# Patient Record
Sex: Male | Born: 2000 | Hispanic: Yes | Marital: Single | Smoking: Never smoker
Health system: Southern US, Community
[De-identification: ages and names within clinical notes are randomized; demographics above are authoritative.]

## PROBLEM LIST (undated history)

## (undated) HISTORY — PX: FOOT SURGERY: SHX648

---
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
## 2019-05-30 ENCOUNTER — Ambulatory Visit: Payer: Self-pay | Attending: Internal Medicine

## 2019-05-30 DIAGNOSIS — Z23 Encounter for immunization: Secondary | ICD-10-CM

## 2019-05-30 NOTE — Progress Notes (Signed)
   Covid-19 Vaccination Clinic  Name:  [place] Villarreal-Montoya    MRN: [id number] DOB: 2000-04-05  05/30/2019  Mr. Savoca was observed post Covid-19 immunization for 15 minutes without incident. He was provided with Vaccine Information Sheet and instruction to access the V-Safe system.   Mr. Bremer was instructed to call 911 with any severe reactions post vaccine: Marland Kitchen Difficulty breathing  . Swelling of face and throat  . A fast heartbeat  . A bad rash all over body  . Dizziness and weakness   Immunizations Administered    Name Date Dose VIS Date Route   Pfizer COVID-19 Vaccine 05/30/2019 10:08 AM 0.3 mL 02/06/2019 Intramuscular   Manufacturer: [employer], [employer]   Lot: [id number]   NDC: [address]-8

## 2019-06-20 ENCOUNTER — Ambulatory Visit: Payer: Self-pay | Attending: Internal Medicine

## 2019-06-20 DIAGNOSIS — Z23 Encounter for immunization: Secondary | ICD-10-CM

## 2019-06-20 NOTE — Progress Notes (Signed)
   Covid-19 Vaccination Clinic  Name:  [place] Villarreal-Montoya    MRN: [id number] DOB: 03-Apr-2000  06/20/2019  Mr. Shukla was observed post Covid-19 immunization for 15 minutes without incident. He was provided with Vaccine Information Sheet and instruction to access the V-Safe system.   Mr. Reth was instructed to call 911 with any severe reactions post vaccine: Marland Kitchen Difficulty breathing  . Swelling of face and throat  . A fast heartbeat  . A bad rash all over body  . Dizziness and weakness   Immunizations Administered    Name Date Dose VIS Date Route   Pfizer COVID-19 Vaccine 06/20/2019 10:08 AM 0.3 mL 04/22/2018 Intramuscular   Manufacturer: [employer], [employer]   Lot: [id number]   NDC: [address]-8

## 2019-09-05 IMAGING — DX DG TIBIA/FIBULA 2V*R*
4 series · 4 of 4 positions shown · non-contrast
Comparison: Right foot series [id number].

CLINICAL DATA: 16-year-old male status post fall from ladder today.
Injury and pain.

EXAM:
RIGHT TIBIA AND FIBULA - 2 VIEW

[tibia ap (1 of 2)]
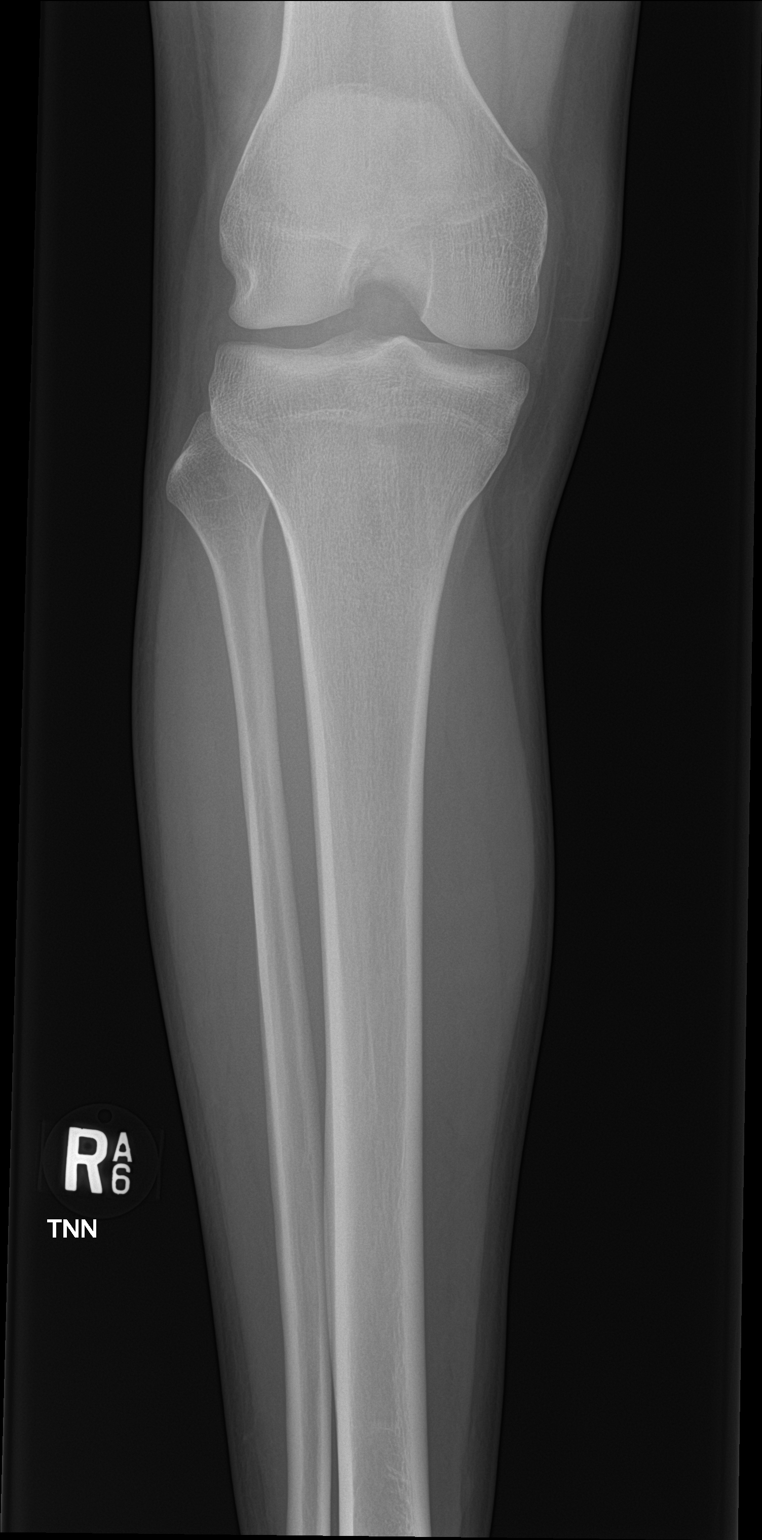

[tibia ap (2 of 2)]
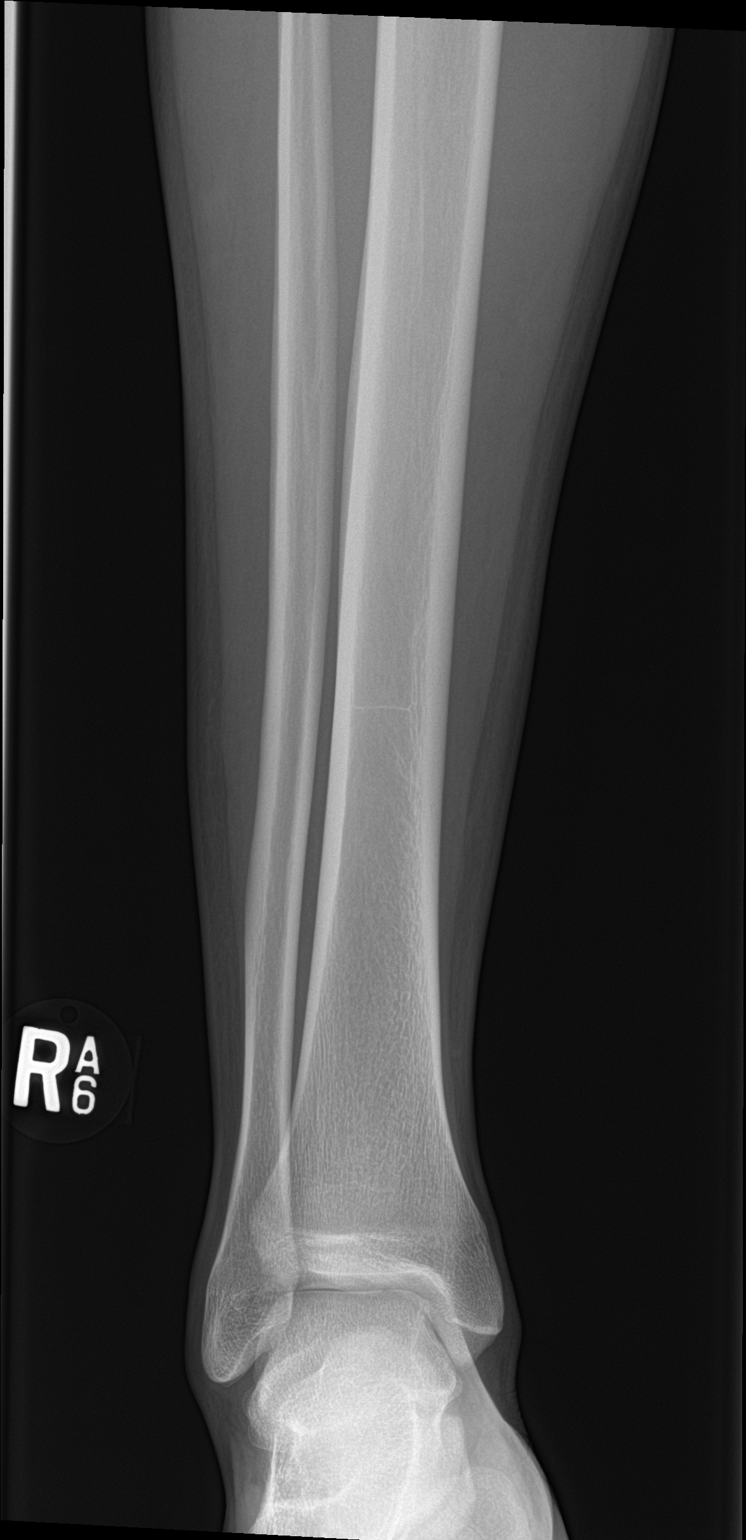

[tibia lat (1 of 2)]
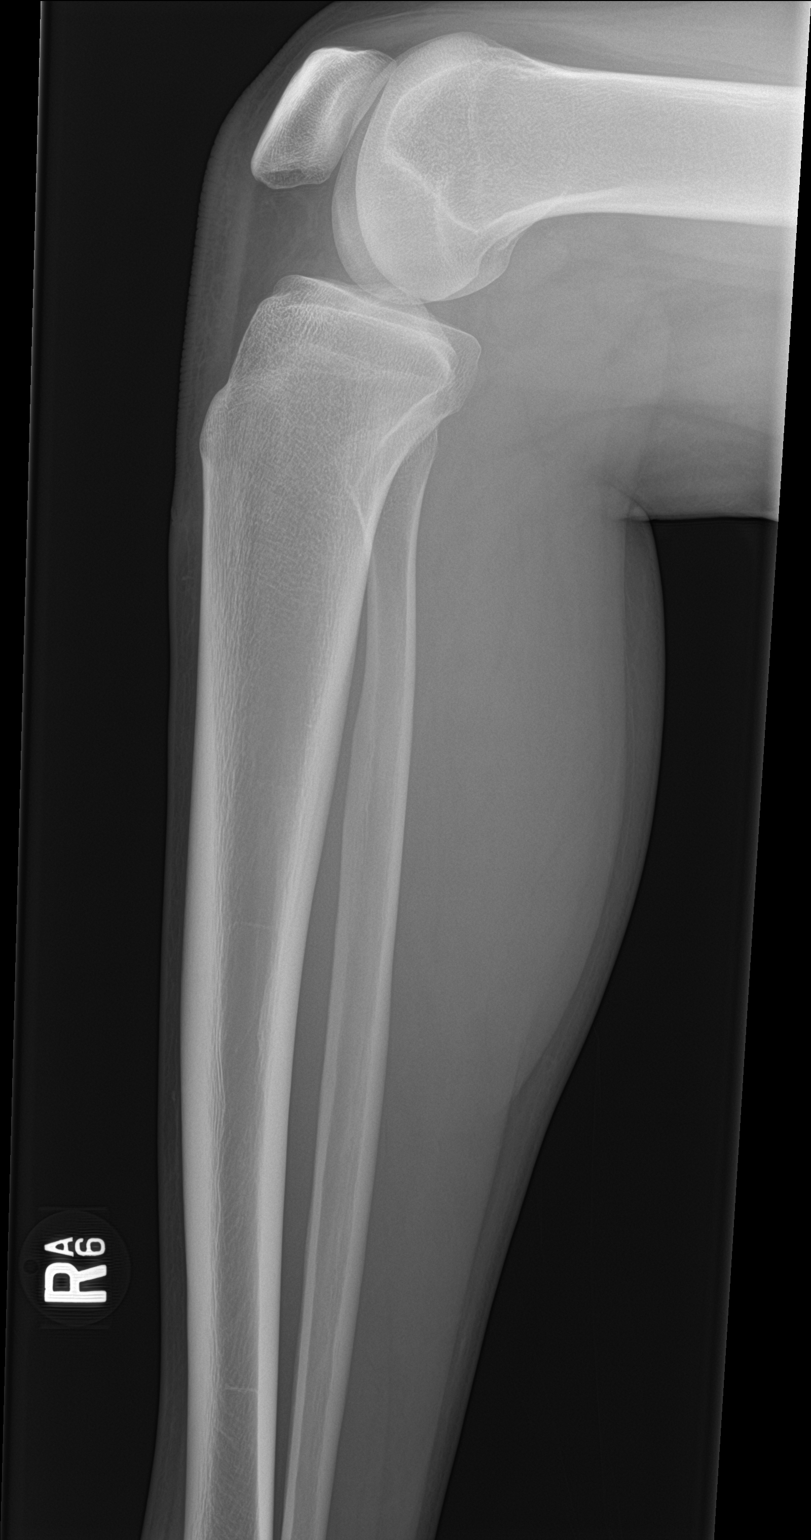

[tibia lat (2 of 2)]
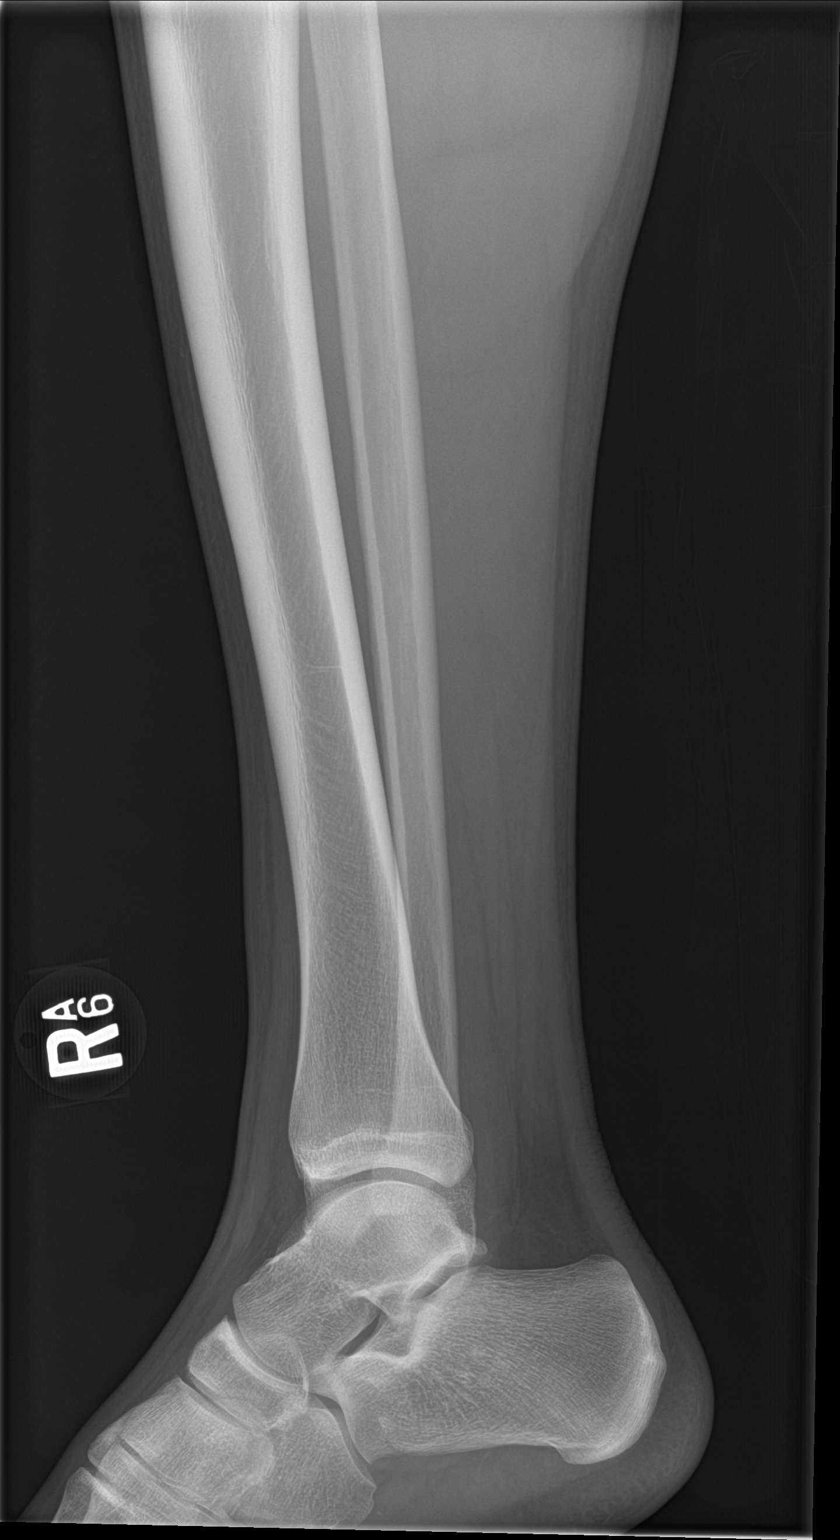

[4 of 4 positions shown; findings below may reference images not displayed]

FINDINGS: Bone mineralization is within normal limits. Alignment at the right
knee and ankle appears preserved. There is no evidence of fracture
or other focal bone lesions. Soft tissues are unremarkable.
IMPRESSION: Negative.
